# Patient Record
Sex: Male | Born: 1975 | Race: White | Hispanic: No | Marital: Married | Smoking: Never smoker
Health system: Southern US, Community
[De-identification: ages and names within clinical notes are randomized; demographics above are authoritative.]

## PROBLEM LIST (undated history)

## (undated) DIAGNOSIS — I350 Nonrheumatic aortic (valve) stenosis: Secondary | ICD-10-CM

## (undated) DIAGNOSIS — I10 Essential (primary) hypertension: Secondary | ICD-10-CM

## (undated) DIAGNOSIS — I519 Heart disease, unspecified: Secondary | ICD-10-CM

---

## 2017-10-30 DIAGNOSIS — R030 Elevated blood-pressure reading, without diagnosis of hypertension: Secondary | ICD-10-CM | POA: Insufficient documentation

## 2019-06-27 ENCOUNTER — Other Ambulatory Visit: Payer: Self-pay

## 2019-06-27 ENCOUNTER — Emergency Department
Admission: EM | Admit: 2019-06-27 | Discharge: 2019-06-27 | Disposition: A | Discharge status: Home / Self Care | Payer: Self-pay | Source: Home / Self Care

## 2019-06-27 ENCOUNTER — Encounter: Payer: Self-pay | Admitting: *Deleted

## 2019-06-27 ENCOUNTER — Emergency Department: Payer: No Typology Code available for payment source

## 2019-06-27 DIAGNOSIS — R1031 Right lower quadrant pain: Secondary | ICD-10-CM

## 2019-06-27 DIAGNOSIS — M79651 Pain in right thigh: Secondary | ICD-10-CM

## 2019-06-27 HISTORY — DX: Nonrheumatic aortic (valve) stenosis: I35.0

## 2019-06-27 HISTORY — DX: Heart disease, unspecified: I51.9

## 2019-06-27 HISTORY — DX: Essential (primary) hypertension: I10

## 2019-06-27 NOTE — ED Triage Notes (Signed)
Pt c/o RT upper leg pain x 4-5 days without injury.

## 2019-06-27 NOTE — Discharge Instructions (Signed)
°  You may take 500mg  acetaminophen every 4-6 hours or in combination with ibuprofen 400-600mg  every 6-8 hours as needed for pain and inflammation.  You may alternate cool and warm compresses 2-3 times daily and perform gentle stretching and massage to help the soreness in your Right thigh.   Epson salt baths may also help along with wearing compression shorts or an ace wrap.  Call Dr. Dianah Field (Dr. Darene Lamer), Sports Medicine, for further evaluation and treatment of symptoms next week if not improving.

## 2019-06-27 NOTE — ED Provider Notes (Signed)
[facility]    CSN: [id number] Arrival date & time: 06/27/19  1139      History   Chief Complaint Chief Complaint  Patient presents with  . Leg Pain    HPI Anthony Jacobs is a 43 y.o. male.   HPI Anthony Jacobs is a 43 y.o. male presenting to UC with c/o Right medial thigh pain along what he believes is one of his veins for about 4-5 days. Pain is aching and sore, especially with walking or when pressure is applied to the area. No known injury. Denies redness, swelling or bruising to the area. No hx of similar symptoms.  Pt concerned it may be a blood clot due to hx of heart disease and aortic stenosis but denies hx of clots in the past.  No pain medication taken PTA.      Past Medical History:  Diagnosis Date  . Aortic stenosis   . Heart disease   . Hypertension     Patient Active Problem List   Diagnosis Date Noted  . Elevated blood pressure reading 10/30/2017    History reviewed. No pertinent surgical history.     Home Medications    Prior to Admission medications   Not on File    Family History Family History  Problem Relation Age of Onset  . Heart disease Mother   . Heart disease Father     Social History Social History   Tobacco Use  . Smoking status: Never Smoker  . Smokeless tobacco: Never Used  Substance Use Topics  . Alcohol use: Never    Frequency: Never  . Drug use: Never     Allergies   Patient has no known allergies.   Review of Systems Review of Systems  Constitutional: Negative for chills and fever.  Respiratory: Negative for chest tightness and shortness of breath.   Cardiovascular: Negative for chest pain, palpitations and leg swelling.  Musculoskeletal: Positive for myalgias. Negative for arthralgias, back pain, gait problem and joint swelling.  Skin: Negative for color change, rash and wound.  Neurological: Negative for dizziness, weakness, light-headedness, numbness and headaches.     Physical Exam  Triage Vital Signs ED Triage Vitals  Enc Vitals Group     BP 06/27/19 1200 (!) 158/88     Pulse Rate 06/27/19 1200 60     Resp 06/27/19 1200 18     Temp 06/27/19 1200 98 F (36.7 C)     Temp Source 06/27/19 1200 Oral     SpO2 06/27/19 1200 99 %     Weight 06/27/19 1201 203 lb (92.1 kg)     Height 06/27/19 1201 6\' 2"  (1.88 m)     Head Circumference --      Peak Flow --      Pain Score 06/27/19 1201 6     Pain Loc --      Pain Edu? --      Excl. in GC? --    No data found.  Updated Vital Signs BP (!) 158/88 (BP Location: Right Arm)   Pulse 60   Temp 98 F (36.7 C) (Oral)   Resp 18   Ht 6\' 2"  (1.88 m)   Wt 203 lb (92.1 kg)   SpO2 99%   BMI 26.06 kg/m   Visual Acuity Right Eye Distance:   Left Eye Distance:   Bilateral Distance:    Right Eye Near:   Left Eye Near:    Bilateral Near:     Physical Exam Vitals  signs and nursing note reviewed.  Constitutional:      Appearance: Normal appearance. He is well-developed.  HENT:     Head: Normocephalic and atraumatic.  Neck:     Musculoskeletal: Normal range of motion.  Cardiovascular:     Rate and Rhythm: Normal rate and regular rhythm.     Pulses:          Dorsalis pedis pulses are 2+ on the right side.  Pulmonary:     Effort: Pulmonary effort is normal.  Musculoskeletal: Normal range of motion.        General: Tenderness present. No swelling.     Comments: Right thigh: mild tenderness along medial aspect. No edema. Full ROM Right hip and knee. Muscle compartments are soft. Calf non-tender.  Skin:    General: Skin is warm and dry.     Findings: No bruising, erythema or rash.  Neurological:     Mental Status: He is alert and oriented to person, place, and time.  Psychiatric:        Behavior: Behavior normal.      UC Treatments / Results  Labs (all labs ordered are listed, but only abnormal results are displayed) Labs Reviewed - No data to display  EKG   Radiology US Venous Img Lower Unilateral Right   Result Date: 06/27/2019 CLINICAL DATA:  Right medial thigh pain for the past 4-5 days. Evaluate for DVT. EXAM: RIGHT LOWER EXTREMITY VENOUS DOPPLER ULTRASOUND TECHNIQUE: Gray-scale sonography with graded compression, as well as color Doppler and duplex ultrasound were performed to evaluate the lower extremity deep venous systems from the level of the common femoral vein and including the common femoral, femoral, profunda femoral, popliteal and calf veins including the posterior tibial, peroneal and gastrocnemius veins when visible. The superficial great saphenous vein was also interrogated. Spectral Doppler was utilized to evaluate flow at rest and with distal augmentation maneuvers in the common femoral, femoral and popliteal veins. COMPARISON:  None. FINDINGS: Contralateral Common Femoral Vein: Respiratory phasicity is normal and symmetric with the symptomatic side. No evidence of thrombus. Normal compressibility. Common Femoral Vein: No evidence of thrombus. Normal compressibility, respiratory phasicity and response to augmentation. Saphenofemoral Junction: No evidence of thrombus. Normal compressibility and flow on color Doppler imaging. Profunda Femoral Vein: No evidence of thrombus. Normal compressibility and flow on color Doppler imaging. Femoral Vein: No evidence of thrombus. Normal compressibility, respiratory phasicity and response to augmentation. Popliteal Vein: No evidence of thrombus. Normal compressibility, respiratory phasicity and response to augmentation. Calf Veins: No evidence of thrombus. Normal compressibility and flow on color Doppler imaging. Superficial Great Saphenous Vein: No evidence of thrombus. Normal compressibility. Venous Reflux:  None. Other Findings:  None. IMPRESSION: No evidence of DVT within the right lower extremity. Electronically Signed   By: Sandi Mariscal M.D.   On: 06/27/2019 13:54    Procedures Procedures (including critical care time)  Medications Ordered in UC  Medications - No data to display  Initial Impression / Assessment and Plan / UC Course  I have reviewed the triage vital signs and the nursing notes.  Pertinent labs & imaging results that were available during my care of the patient were reviewed by me and considered in my medical decision making (see chart for details).     Reviewed imaging with pt, reassured no clot.  Offered prescription pain medication for the evening, pt declined. Work note provided for 2 days off, 3 days light duty. AVS provided.  Final Clinical Impressions(s) / UC Diagnoses  Final diagnoses:  Right thigh pain  Right groin pain     Discharge Instructions      You may take 500mg  acetaminophen every 4-6 hours or in combination with ibuprofen 400-600mg  every 6-8 hours as needed for pain and inflammation.  You may alternate cool and warm compresses 2-3 times daily and perform gentle stretching and massage to help the soreness in your Right thigh.   Epson salt baths may also help along with wearing compression shorts or an ace wrap.  Call Dr. Benjamin Stain (Dr. Karie Schwalbe), Sports Medicine, for further evaluation and treatment of symptoms next week if not improving.       ED Prescriptions    None     Controlled Substance Prescriptions [place] Controlled Substance Registry consulted? Not Applicable   Rolla Plate 06/27/19 1452

## 2019-08-27 ENCOUNTER — Other Ambulatory Visit: Payer: Self-pay

## 2019-08-27 DIAGNOSIS — Z20822 Contact with and (suspected) exposure to covid-19: Secondary | ICD-10-CM

## 2019-08-28 LAB — NOVEL CORONAVIRUS, NAA: SARS-CoV-2, NAA: NOT DETECTED

## 2019-08-29 ENCOUNTER — Telehealth: Payer: Self-pay | Admitting: General Practice

## 2019-08-29 NOTE — Telephone Encounter (Signed)
Negative COVID results given. Patient results "NOT Detected." Caller expressed understanding. ° °

## 2021-09-04 IMAGING — US US EXTREM LOW VENOUS*R*
1 series · 13 of 24 positions shown · non-contrast
Comparison: None.

CLINICAL DATA: Right medial thigh pain for the past 4-5 days.
Evaluate for DVT.



[Series 1: us extrem low venous*right* · 0.08mm/px · 13 of 44 slices shown]
[im 1/44]
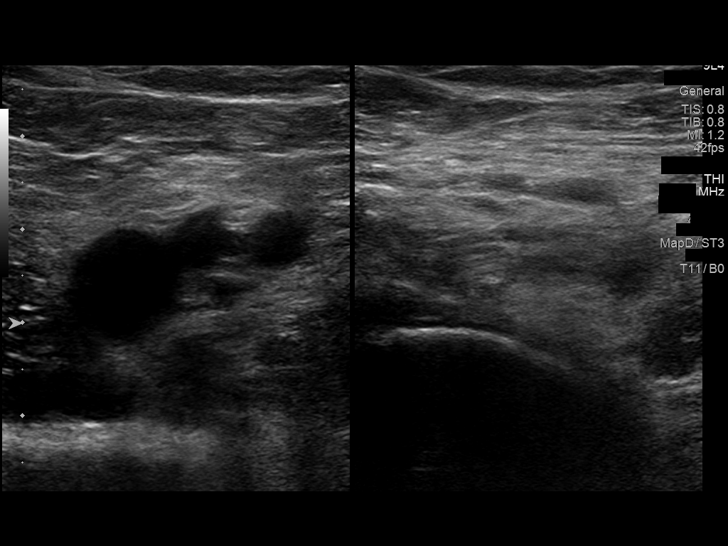
[im 4/44]
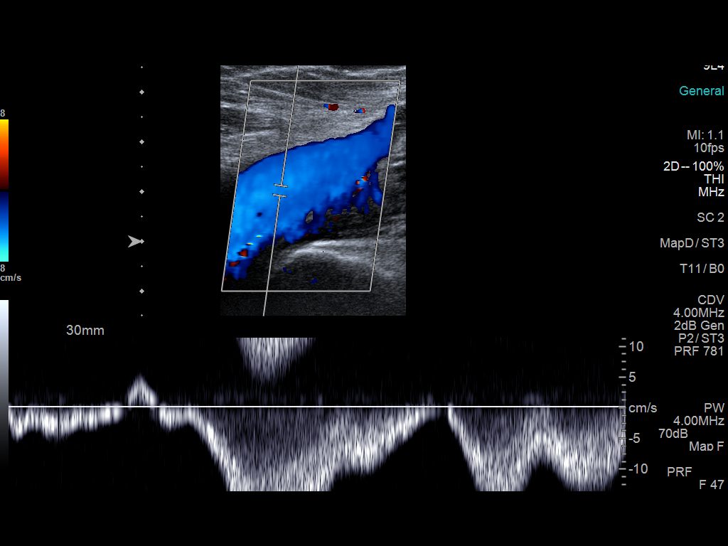
[im 8/44]
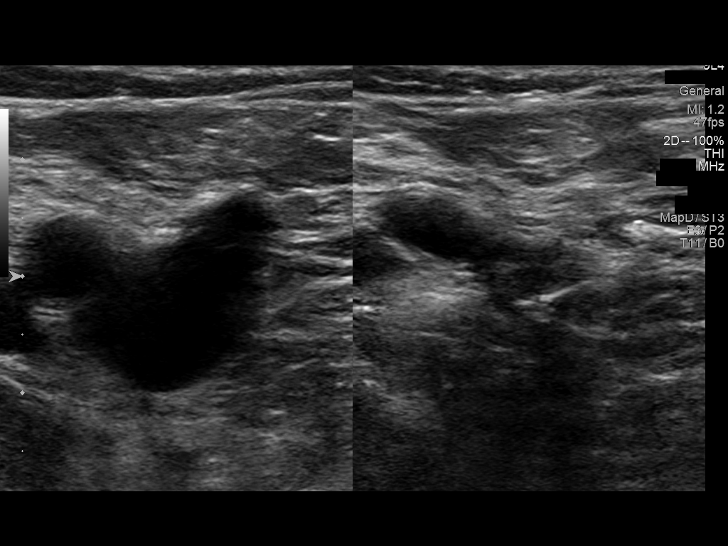
[im 12/44]
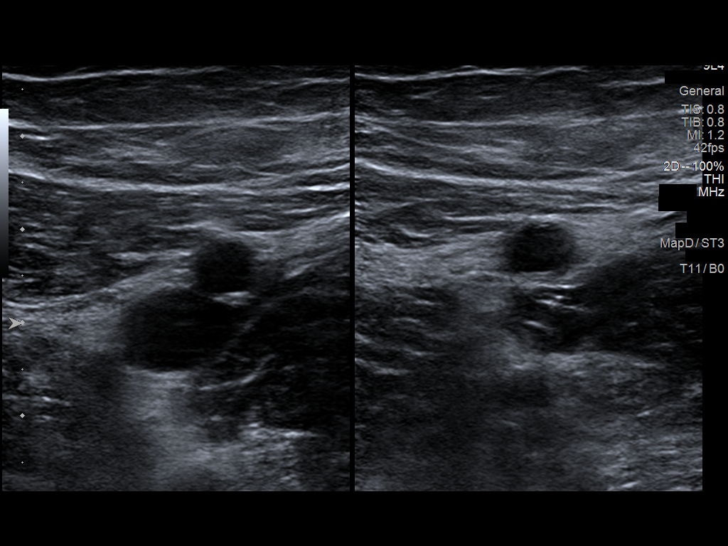
[im 15/44]
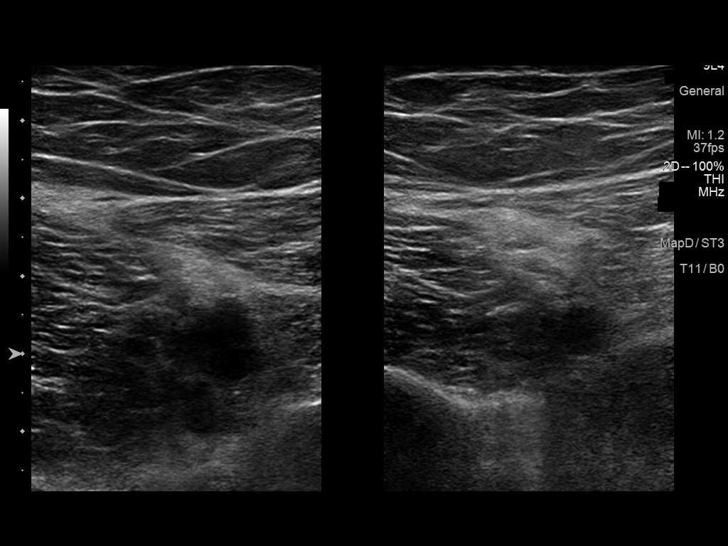
[im 19/44]
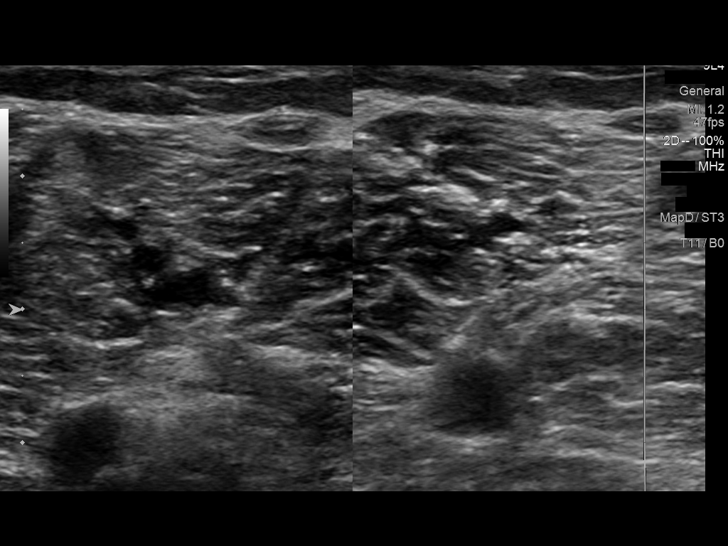
[im 23/44]
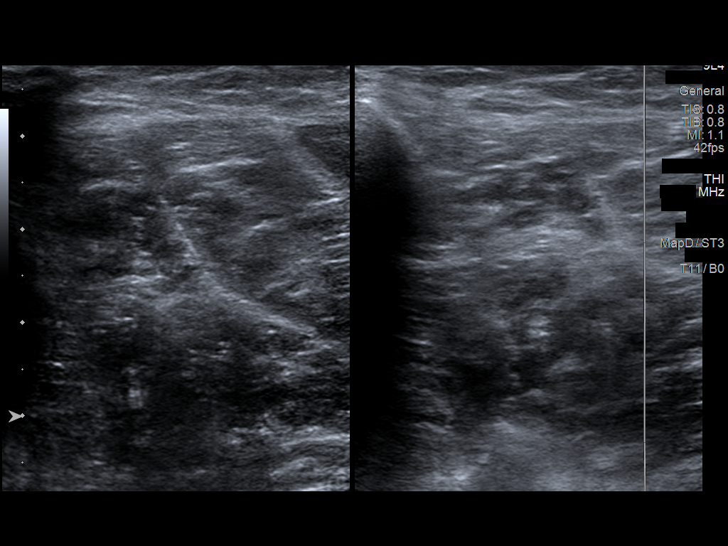
[im 25/44]
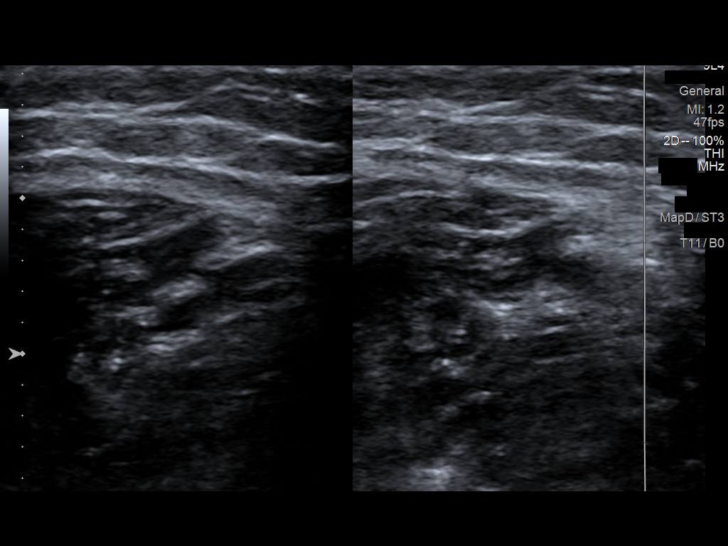
[im 29/44]
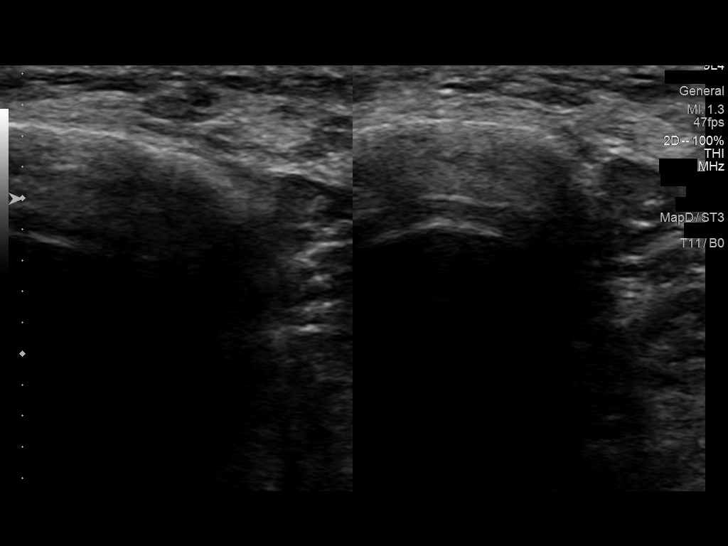
[im 32/44]
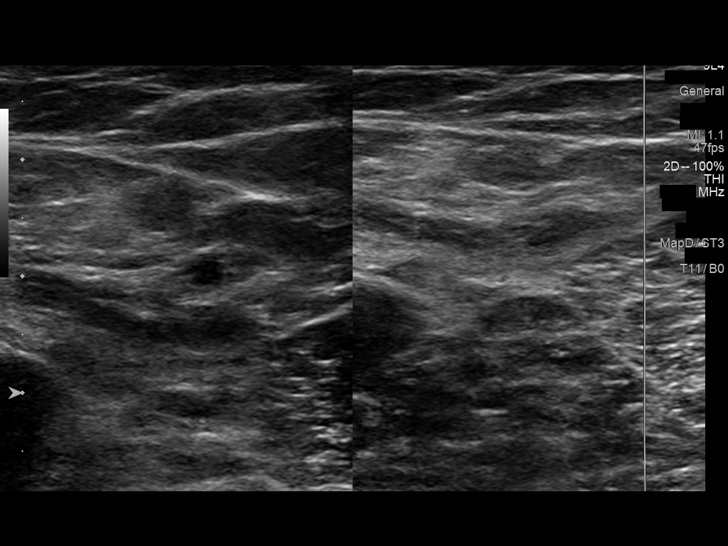
[im 36/44]
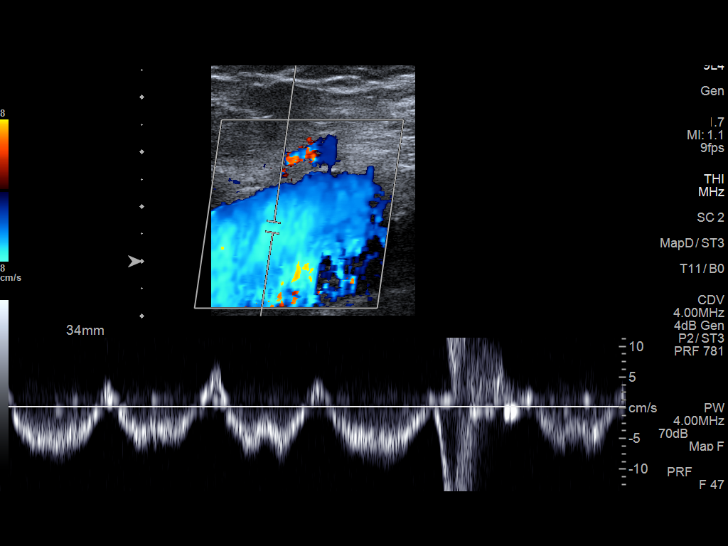
[im 40/44]
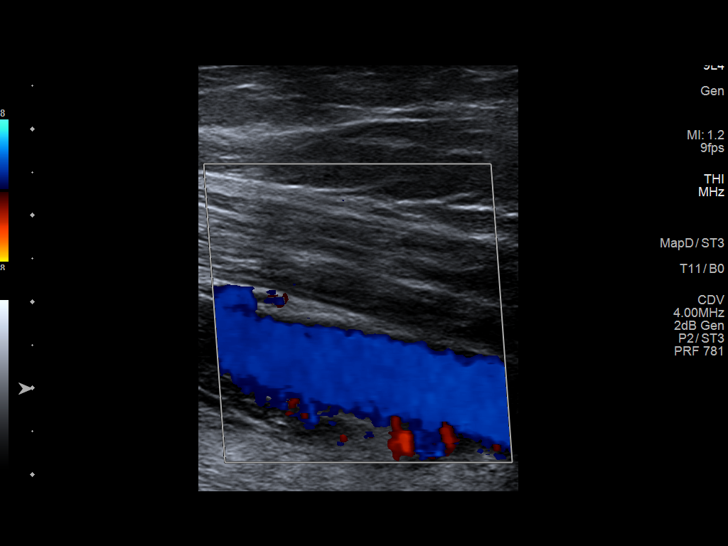
[im 44/44]
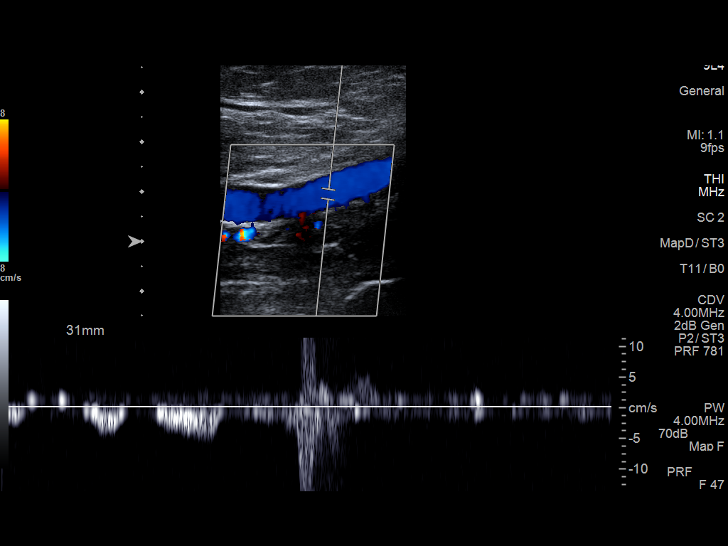

[13 of 24 positions shown; findings below may reference images not displayed]

FINDINGS: Contralateral Common Femoral Vein: Respiratory phasicity is normal
and symmetric with the symptomatic side. No evidence of thrombus.
Normal compressibility.

Common Femoral Vein: No evidence of thrombus. Normal
compressibility, respiratory phasicity and response to augmentation.

Saphenofemoral Junction: No evidence of thrombus. Normal
compressibility and flow on color Doppler imaging.

Profunda Femoral Vein: No evidence of thrombus. Normal
compressibility and flow on color Doppler imaging.

Femoral Vein: No evidence of thrombus. Normal compressibility,
respiratory phasicity and response to augmentation.

Popliteal Vein: No evidence of thrombus. Normal compressibility,
respiratory phasicity and response to augmentation.

Calf Veins: No evidence of thrombus. Normal compressibility and flow
on color Doppler imaging.

Superficial Great Saphenous Vein: No evidence of thrombus. Normal
compressibility.

Venous Reflux:  None.

Other Findings:  None.
IMPRESSION: No evidence of DVT within the right lower extremity.
